# Patient Record
Sex: Male | Born: 1940 | Race: White | Hispanic: No | Smoking: Former smoker
Health system: Southern US, Community
[De-identification: ages and names within clinical notes are randomized; demographics above are authoritative.]

## PROBLEM LIST (undated history)

## (undated) DIAGNOSIS — C801 Malignant (primary) neoplasm, unspecified: Secondary | ICD-10-CM

---

## 2017-01-13 ENCOUNTER — Institutional Professional Consult (permissible substitution): Payer: Self-pay | Admitting: Internal Medicine

## 2017-01-22 ENCOUNTER — Encounter: Payer: Self-pay | Admitting: Internal Medicine

## 2017-01-22 ENCOUNTER — Ambulatory Visit (INDEPENDENT_AMBULATORY_CARE_PROVIDER_SITE_OTHER)
Admission: RE | Admit: 2017-01-22 | Discharge: 2017-01-22 | Disposition: A | Discharge status: Home / Self Care | Payer: Medicare Other | Source: Ambulatory Visit | Attending: Internal Medicine | Admitting: Internal Medicine

## 2017-01-22 ENCOUNTER — Telehealth: Payer: Self-pay | Admitting: Internal Medicine

## 2017-01-22 ENCOUNTER — Ambulatory Visit (INDEPENDENT_AMBULATORY_CARE_PROVIDER_SITE_OTHER): Payer: Medicare Other | Admitting: Internal Medicine

## 2017-01-22 VITALS — BP 130/60 | HR 65 | Ht 69.5 in | Wt 212.8 lb

## 2017-01-22 DIAGNOSIS — R938 Abnormal findings on diagnostic imaging of other specified body structures: Secondary | ICD-10-CM | POA: Diagnosis not present

## 2017-01-22 DIAGNOSIS — J449 Chronic obstructive pulmonary disease, unspecified: Secondary | ICD-10-CM

## 2017-01-22 DIAGNOSIS — R9389 Abnormal findings on diagnostic imaging of other specified body structures: Secondary | ICD-10-CM

## 2017-01-22 MED ORDER — BUDESONIDE-FORMOTEROL FUMARATE 80-4.5 MCG/ACT IN AERO: 2.0000 | INHALATION_SPRAY | Freq: Two times a day (BID) | RESPIRATORY_TRACT | 0 refills | Status: AC

## 2017-01-22 NOTE — Telephone Encounter (Signed)
See ov / result note same day

## 2017-01-22 NOTE — Telephone Encounter (Signed)
Received call report for pt's CXR today 01/22/17 from [facility] radiology. Impression is below:   IMPRESSION: Cardiomegaly.  Fullness in the right infrahilar region. Contrast enhanced CT of the chest is recommended for further evaluation.  These results will be called to the ordering clinician or representative by the Radiologist Assistant, and communication documented in the PACS or zVision Dashboard.   Electronically Signed   By: Inez Catalina M.D.   On: 01/22/2017 12:41

## 2017-01-22 NOTE — Progress Notes (Signed)
Subjective:    Patient ID: Bradley Zimmerman, male    DOB: 08-24-1941,    MRN: [id number]  HPI  53 yowm  Quit smoking 2008 without any sequelae but around 2012 onset of doe gradually worse   so referred to pulmonary clinic 01/22/2017 by Dr   Orma Render - pt is s/p MVR and CAF   01/22/2017 1st [facility] office visit/ Tegan Britain   Chief Complaint  Patient presents with  . Pulmonary Consult    Referred by Dr. Doristine Section Bonsu. Pt states that he had been having SOB for the past year, but since started on Symbicort 1 month ago. He states that now his breaething is "100% better".   gradually better since taking symb and cut on avg one puff each am and no need now for saba  Doe = MMRC1 = can walk nl pace, flat grade, can't hurry or go uphills or steps s sob    Sleep fine/ wakes up fine s rattling/ congestion overnight   No obvious day to day or daytime variability or assoc excess/ purulent sputum or mucus plugs or hemoptysis or cp or chest tightness, subjective wheeze or overt sinus or hb symptoms. No unusual exp hx or h/o childhood pna/ asthma or knowledge of premature birth.  Sleeping ok without nocturnal  or early am exacerbation  of respiratory  c/o's or need for noct saba. Also denies any obvious fluctuation of symptoms with weather or environmental changes or other aggravating or alleviating factors except as outlined above   Current Medications, Allergies, Complete Past Medical History, Past Surgical History, Family History, and Social History were reviewed in [employer] record.  ROS  The following are not active complaints unless bolded sore throat, dysphagia, dental problems, itching, sneezing,  nasal congestion or excess/ purulent secretions, ear ache,   fever, chills, sweats, unintended wt loss, classically pleuritic or exertional cp,  orthopnea pnd or leg swelling, presyncope, palpitations, abdominal pain, anorexia, nausea, vomiting, diarrhea  or change in bowel or  bladder habits, change in stools or urine, dysuria,hematuria,  rash, arthralgias, visual complaints, headache, numbness, weakness or ataxia or problems with walking or coordination,  change in mood/affect or memory.             Review of Systems     Objective:   Physical Exam     amb somber wm nad occ throat clearing   Wt Readings from Last 3 Encounters:  01/22/17 212 lb 12.8 oz (96.5 kg)    Vital signs reviewed  - Note on arrival 02 sats  98% on RA    HEENT: nl  turbinates bilaterally, and oropharynx. Nl external ear canals without cough reflex- Full denture above/ partial below   NECK :  without JVD/Nodes/TM/ nl carotid upstrokes bilaterally   LUNGS: no acc muscle use,   Distant bs bilaterally with hyper resonance to percussion/ no cough on insp or exp    CV:  IRIR   no s3 with mechanical S1  no murmur or increase in P2, and  1+ sym pitting lower ext  edema   ABD:  soft and nontender with   Pos hoover's end inspiratory   in the supine position. No bruits or organomegaly appreciated, bowel sounds nl  MS:  Nl gait/ ext warm without deformities, calf tenderness, cyanosis or clubbing No obvious joint restrictions   SKIN: warm and dry without lesions    NEURO:  alert, approp, nl sensorium with  no motor or cerebellar  deficits apparent.     CXR PA and Lateral:   01/22/2017 :    I personally reviewed images and agree with radiology impression as follows:   Cardiomegaly. Fullness in the right infrahilar region. Contrast enhanced CT of the chest is recommended for further evaluation.        Assessment & Plan:

## 2017-01-22 NOTE — Patient Instructions (Addendum)
Work on inhaler technique:  relax and gently blow all the way out then take a nice smooth deep breath back in, triggering the inhaler at same time you start breathing in.  Hold for up to 5 seconds if you can. Blow out thru nose. Rinse and gargle with water when done   Please remember to go to the   x-ray department downstairs in the basement  for your tests - we will call you with the results when they are available.   if your cough or throat clearing start to bother you more you will need a trial off of Ramapril  X 6 weeks before additional work up should be considered    Add CT with contrast needed re ? Hilar mass vs large PA

## 2017-01-23 ENCOUNTER — Other Ambulatory Visit: Payer: Self-pay | Admitting: Internal Medicine

## 2017-01-23 DIAGNOSIS — R9389 Abnormal findings on diagnostic imaging of other specified body structures: Secondary | ICD-10-CM

## 2017-01-23 NOTE — Assessment & Plan Note (Signed)
See cxr 01/22/2017 > Ct with contrast rec (last bun/creat =  26/1.33 10/16/16   I suspect what we're seeing is large PA's but agree with radiology need to r/o mass

## 2017-01-23 NOTE — Assessment & Plan Note (Signed)
Spirometry 01/22/2017  FEV1 1.29 (44%)  Ratio 52 p nothing prior to study - 01/22/2017  After extensive coaching HFA effectiveness =    75%    Despite suboptimal hfa and obvious co-existing heart dz / acei /coreg use his breathing is actually relatively good and not having aecopd by hx so could use lama by itself here or lama/laba if breathing gets limited but doing so well on such low doses of symbicort there's probably no reason to change rx   Reviewed with pt: .> 3 min discussion I reviewed the Fletcher curve with the patient that basically indicates  if you quit smoking when your best day FEV1 is  preserved (as is relatively still the case here)  it is highly unlikely you will progress to severe disease and informed the patient there was  no medication on the market that has proven to alter the curve/ its downward trajectory  or the likelihood of progression of their disease(unlike other chronic medical conditions such as atheroclerosis where we do think we can change the natural hx with risk reducing meds)    Therefore s  maintaining abstinence is the most important aspect of care, not choice of inhalers or for that matter, doctors.     If breathing does deteriorate, would obviously max symb dosing to 2 bid and rx chf/ consider ACEi effects and would prefer in this setting: Bystolic, the most beta -1  selective Beta blocker available in sample form, with bisoprolol the most selective generic choice  on the market.   Since this is not an issue now though I did not rec any changes  Total time devoted to counseling  > 50 % of initial 60 min office visit:  review case with pt/ discussion of options/alternatives/ personally creating written customized instructions  in presence of pt  then going over those specific  Instructions directly with the pt including how to use all of the meds but in particular covering each new medication in detail and the difference between the maintenance= "automatic" meds and the  prns using an action plan format for the latter (If this problem/symptom => do that organization reading Left to right).  Please see AVS from this visit for a full list of these instructions which I personally wrote for this pt and  are unique to this visit.

## 2017-01-23 NOTE — Progress Notes (Signed)
Spoke with pt and notified of results per Dr. Wert. Pt verbalized understanding and denied any questions. 

## 2017-02-04 ENCOUNTER — Encounter: Payer: Self-pay | Admitting: Internal Medicine

## 2017-02-04 ENCOUNTER — Other Ambulatory Visit (INDEPENDENT_AMBULATORY_CARE_PROVIDER_SITE_OTHER): Payer: Medicare Other

## 2017-02-04 DIAGNOSIS — R938 Abnormal findings on diagnostic imaging of other specified body structures: Secondary | ICD-10-CM | POA: Diagnosis not present

## 2017-02-04 DIAGNOSIS — I10 Essential (primary) hypertension: Secondary | ICD-10-CM | POA: Insufficient documentation

## 2017-02-04 DIAGNOSIS — R9389 Abnormal findings on diagnostic imaging of other specified body structures: Secondary | ICD-10-CM

## 2017-02-04 LAB — BASIC METABOLIC PANEL
BUN: 31 mg/dL — ABNORMAL HIGH (ref 6–23)
CO2: 26 mEq/L (ref 19–32)
Calcium: 10 mg/dL (ref 8.4–10.5)
Chloride: 105 mEq/L (ref 96–112)
Creatinine, Ser: 1.65 mg/dL — ABNORMAL HIGH (ref 0.40–1.50)
GFR: 43.27 mL/min — AB (ref >60.00)
GLUCOSE: 116 mg/dL — AB (ref 70–99)
POTASSIUM: 5.5 meq/L — AB (ref 3.5–5.1)
SODIUM: 137 meq/L (ref 135–145)

## 2017-02-05 ENCOUNTER — Other Ambulatory Visit: Payer: Self-pay | Admitting: Nephrology

## 2017-02-05 ENCOUNTER — Ambulatory Visit (HOSPITAL_BASED_OUTPATIENT_CLINIC_OR_DEPARTMENT_OTHER)
Admission: RE | Admit: 2017-02-05 | Discharge: 2017-02-05 | Disposition: A | Discharge status: Home / Self Care | Payer: Medicare Other | Source: Ambulatory Visit | Attending: Internal Medicine | Admitting: Internal Medicine

## 2017-02-05 ENCOUNTER — Encounter (HOSPITAL_BASED_OUTPATIENT_CLINIC_OR_DEPARTMENT_OTHER): Payer: Self-pay

## 2017-02-05 DIAGNOSIS — R938 Abnormal findings on diagnostic imaging of other specified body structures: Secondary | ICD-10-CM | POA: Diagnosis present

## 2017-02-05 DIAGNOSIS — N183 Chronic kidney disease, stage 3 unspecified: Secondary | ICD-10-CM

## 2017-02-05 DIAGNOSIS — R918 Other nonspecific abnormal finding of lung field: Secondary | ICD-10-CM | POA: Insufficient documentation

## 2017-02-05 DIAGNOSIS — R591 Generalized enlarged lymph nodes: Secondary | ICD-10-CM | POA: Diagnosis not present

## 2017-02-05 DIAGNOSIS — R9389 Abnormal findings on diagnostic imaging of other specified body structures: Secondary | ICD-10-CM

## 2017-02-05 HISTORY — DX: Malignant (primary) neoplasm, unspecified: C80.1

## 2017-02-05 MED ORDER — IOPAMIDOL (ISOVUE-300) INJECTION 61%
100.0000 mL | Freq: Once | INTRAVENOUS | Status: AC | PRN
  Administered 2017-02-05: 60 mL via INTRAVENOUS

## 2017-02-05 NOTE — Progress Notes (Signed)
LMTCB

## 2017-02-06 ENCOUNTER — Other Ambulatory Visit: Payer: Self-pay | Admitting: Internal Medicine

## 2017-02-06 DIAGNOSIS — J449 Chronic obstructive pulmonary disease, unspecified: Secondary | ICD-10-CM

## 2017-02-06 DIAGNOSIS — R9389 Abnormal findings on diagnostic imaging of other specified body structures: Secondary | ICD-10-CM

## 2017-02-12 ENCOUNTER — Ambulatory Visit
Admission: RE | Admit: 2017-02-12 | Discharge: 2017-02-12 | Disposition: A | Discharge status: Home / Self Care | Payer: Medicare Other | Source: Ambulatory Visit | Attending: Nephrology | Admitting: Nephrology

## 2017-02-12 DIAGNOSIS — N183 Chronic kidney disease, stage 3 unspecified: Secondary | ICD-10-CM

## 2017-02-17 ENCOUNTER — Encounter (HOSPITAL_COMMUNITY)
Admission: RE | Admit: 2017-02-17 | Discharge: 2017-02-17 | Disposition: A | Discharge status: Home / Self Care | Payer: Medicare Other | Source: Ambulatory Visit | Attending: Internal Medicine | Admitting: Internal Medicine

## 2017-02-17 DIAGNOSIS — R938 Abnormal findings on diagnostic imaging of other specified body structures: Secondary | ICD-10-CM | POA: Insufficient documentation

## 2017-02-17 DIAGNOSIS — R9389 Abnormal findings on diagnostic imaging of other specified body structures: Secondary | ICD-10-CM

## 2017-02-17 LAB — GLUCOSE, CAPILLARY: Glucose-Capillary: 112 mg/dL — ABNORMAL HIGH (ref 65–99)

## 2017-02-17 MED ORDER — FLUDEOXYGLUCOSE F - 18 (FDG) INJECTION: 9.6000 | Freq: Once | INTRAVENOUS | Status: DC | PRN

## 2017-03-11 NOTE — Progress Notes (Signed)
Dr. Melvyn Novas- I was able to speak with the pt by calling his son Jeremy's phone. Pt states that he has been admitted to [place] and was dxed with Lung CA. This is why he did not return our calls. You can review his records in [place]. Please advise if further testing is still needed.

## 2017-07-23 DEATH — deceased

## 2017-12-16 IMAGING — CT CT CHEST W/ CM
2 of 3 series · 15 of 36 positions shown, 18 images · IV contrast (iopamidol)
Comparison: Chest radiographs dated 01/22/2017

CLINICAL DATA: Abnormal chest radiograph

EXAM:
CT CHEST WITH CONTRAST
TECHNIQUE: Multidetector CT imaging of the chest was performed during
intravenous contrast administration.
CONTRAST:  60mL USAPMB-M55 IOPAMIDOL (USAPMB-M55) INJECTION 61%

[Series 2: axial st · axial · 0.73mm/px · z∈[+1134,+1410]mm · 12 of 163 slices shown, 15 images]
[im 13/163  mediastinal]
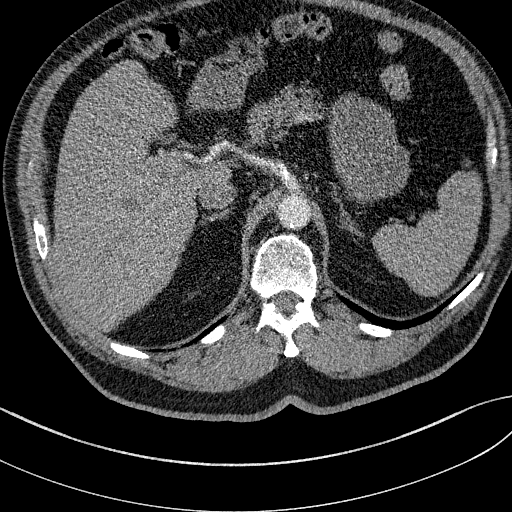
[im 13/163  lung]
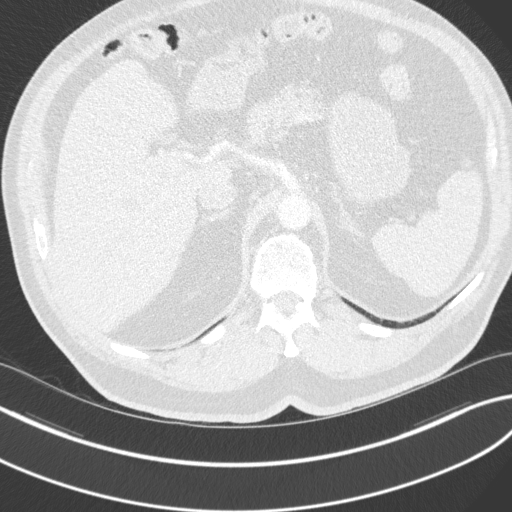
[im 25/163  lung]
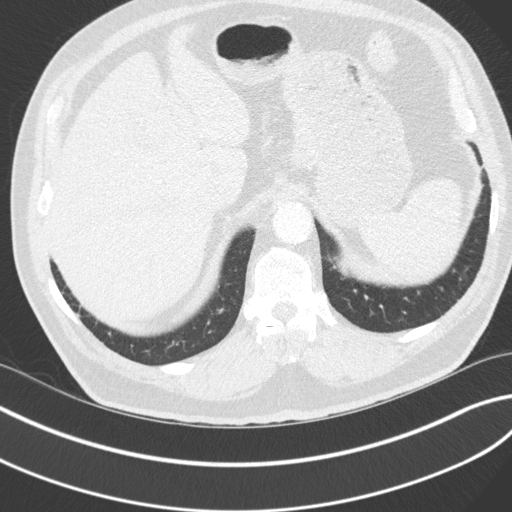
[im 37/163  lung]
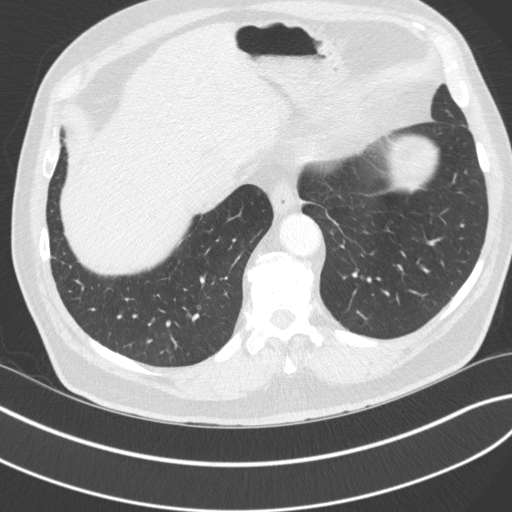
[im 49/163  lung]
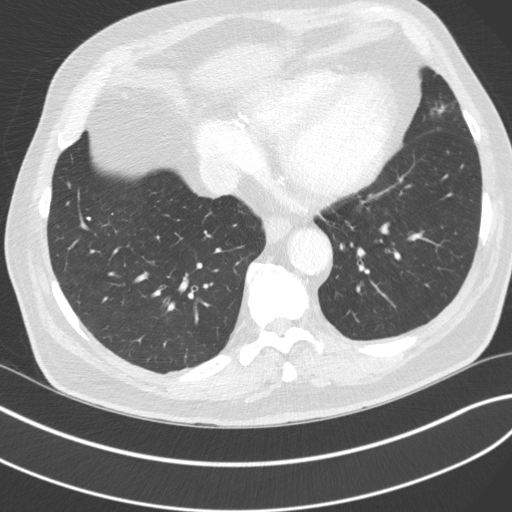
[im 61/163  mediastinal]
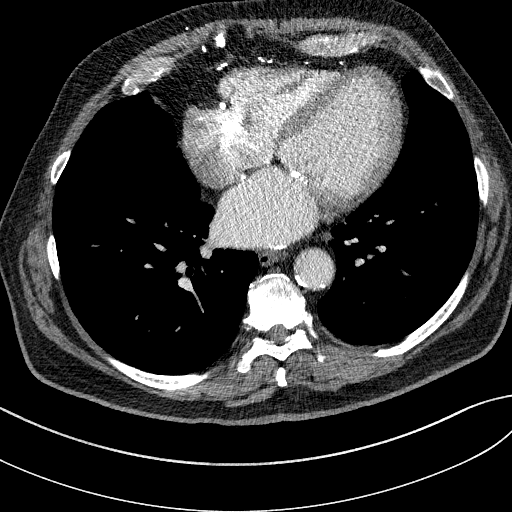
[im 61/163  lung]
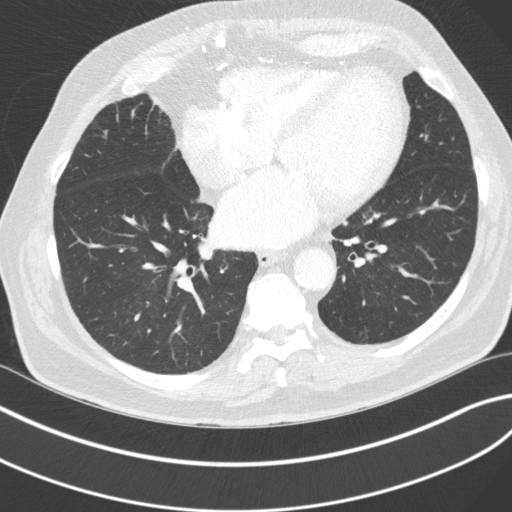
[im 73/163  lung]
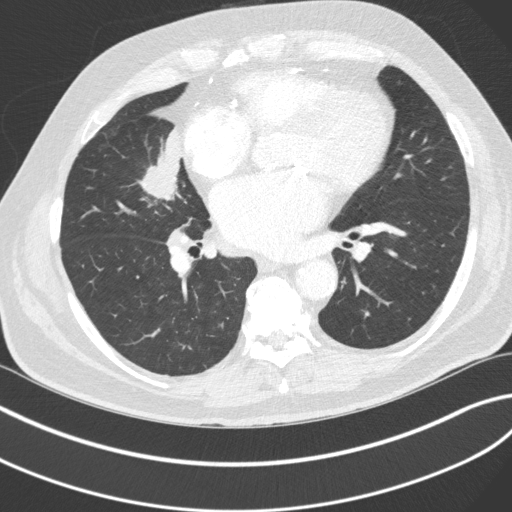
[im 91/163  lung]
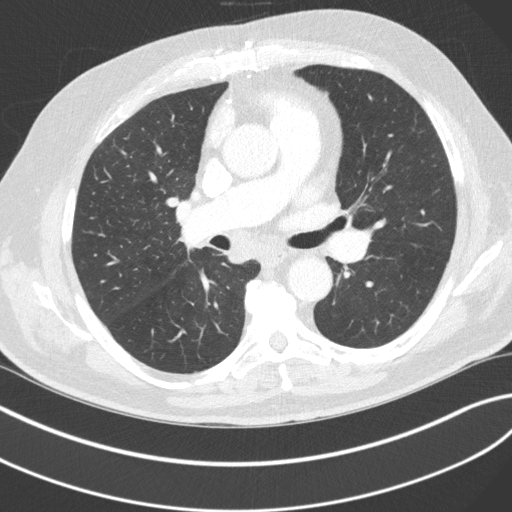
[im 103/163  lung]
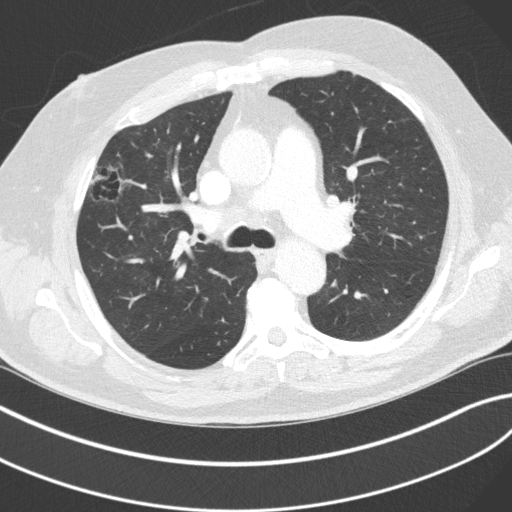
[im 115/163  mediastinal]
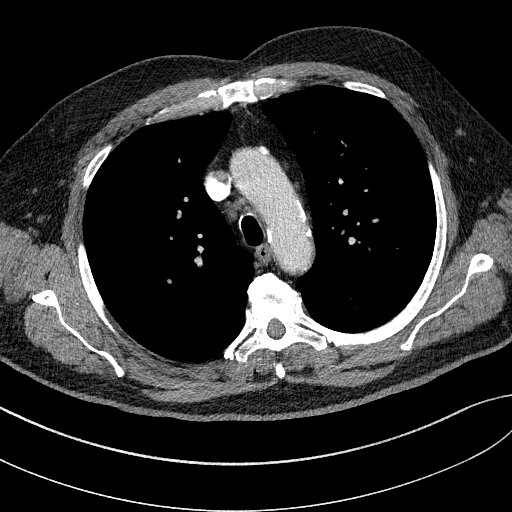
[im 115/163  lung]
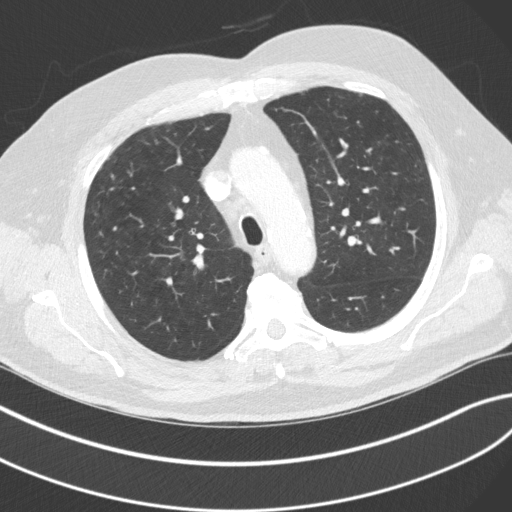
[im 127/163  lung]
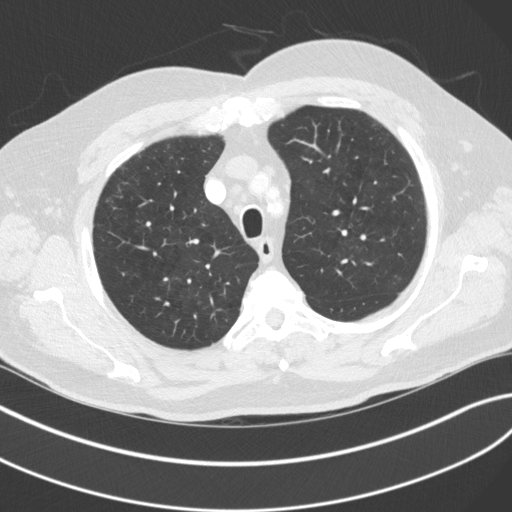
[im 139/163  lung]
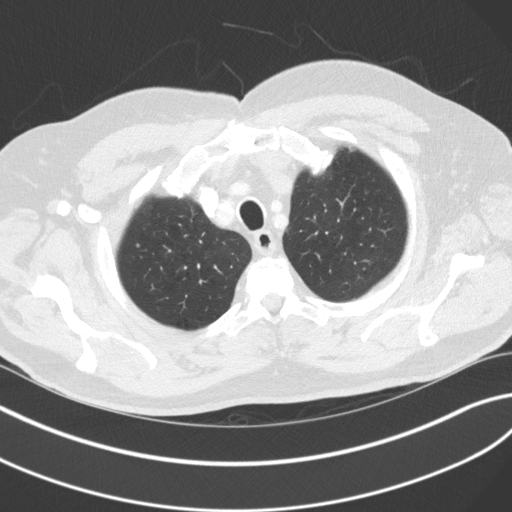
[im 151/163  lung]
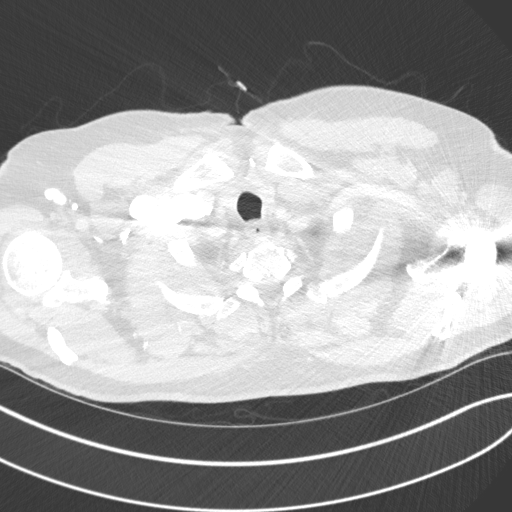

[Series 5: coronal · coronal · 0.63mm/px · 3 of 170 slices shown]
[im 34/170  lung]
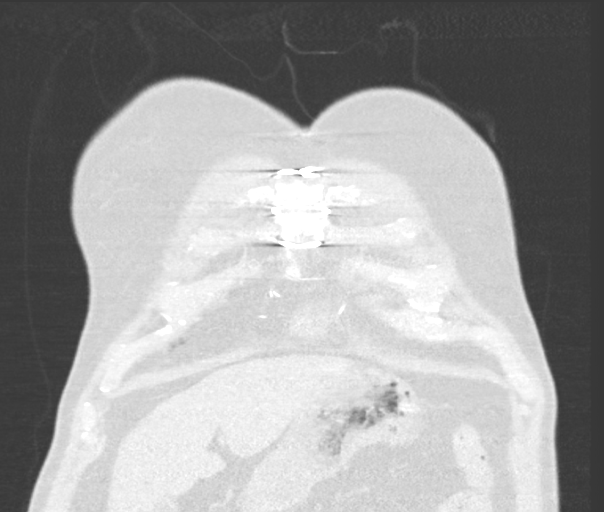
[im 68/170  lung]
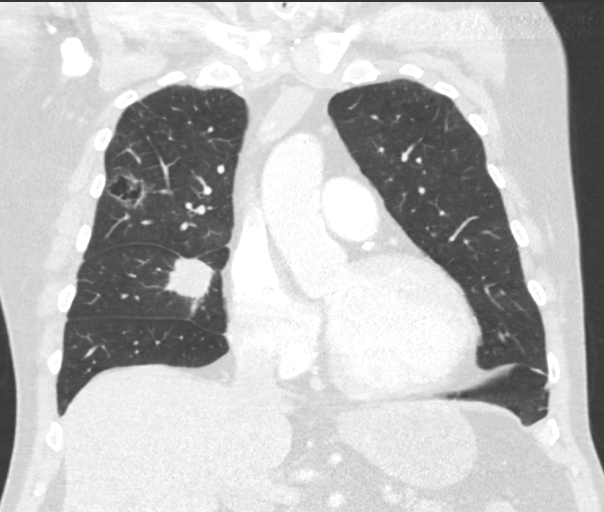
[im 102/170  lung]
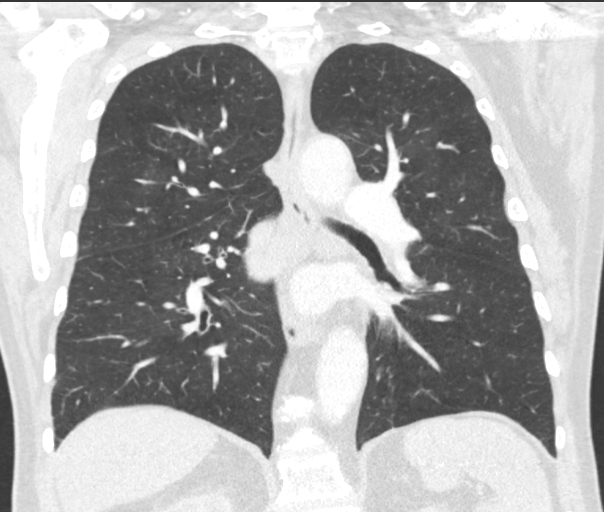

[15 of 36 positions shown; findings below may reference images not displayed]

FINDINGS: Cardiovascular: The heart is normal in size. No pericardial
effusion.

Coronary atherosclerosis the LAD and right coronary artery.

Prosthetic mitral valve.

Atherosclerotic calcification the aortic arch. No evidence of
thoracic aortic aneurysm.

Mediastinum/Nodes: Thoracic lymph nodes, including:

--1.0 cm short axis high right paratracheal node (series 2/ image
41)

--1.8 cm short axis low right paratracheal node (series 2/image 57)

--1.4 cm short axis right hilar node (series 2/image 67)

--2.0 cm short axis node along the right azygoesophageal recess
(series 2/ image 84)

Visualized thyroid is unremarkable.

Lungs/Pleura: 4.5 x 2.5 x 3.5 cm right middle lobe mass-like opacity
(series 3/ image 88). While some of this opacity may reflect
postobstructive atelectasis medially (series 3/image 95), the
spiculated nature of the opacity centrally/laterally is highly
concerning for primary bronchogenic neoplasm (coronal image 70).

3.5 cm thin-walled cavitary lesion in the anterior right upper lobe
(series 3/ image 60).

3 mm calcified granuloma in the right upper lobe along the minor
fissure (series 3/image 77). Additional 4 mm calcified granuloma in
the left lower lobe (series 3/ image 110). 3 mm right lower lobe
calcified granuloma (series 3/image 99).

Underlying mild centrilobular emphysematous changes in the bilateral
upper lobes.

No focal consolidation.

No pleural effusion or pneumothorax.

Upper Abdomen: Visualized upper abdomen is notable for an 11 mm
probable cyst in the central liver (series 2/image 125).

Musculoskeletal: Degenerative changes of the visualized
thoracolumbar spine.

Median sternotomy.
IMPRESSION: 4.5 cm right middle lobe mass-like opacity, highly concerning for
primary bronchogenic neoplasm. Consider bronchoscopy, percutaneous
sampling, and/or PET-CT as clinically warranted.

Mediastinal and right hilar lymphadenopathy, suspicious for nodal
metastases.

## 2019-03-14 IMAGING — US US RENAL
1 series · 14 of 25 positions shown · non-contrast
Comparison: None.

CLINICAL DATA: Chronic kidney disease

EXAM:
RENAL / URINARY TRACT ULTRASOUND COMPLETE

[Series 1: us renal · 0.25mm/px · 14 of 28 slices shown]
[im 1/28]
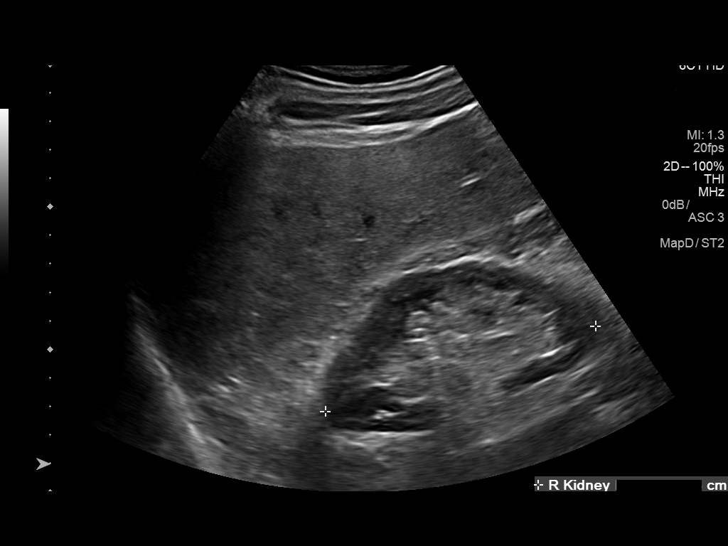
[im 3/28]
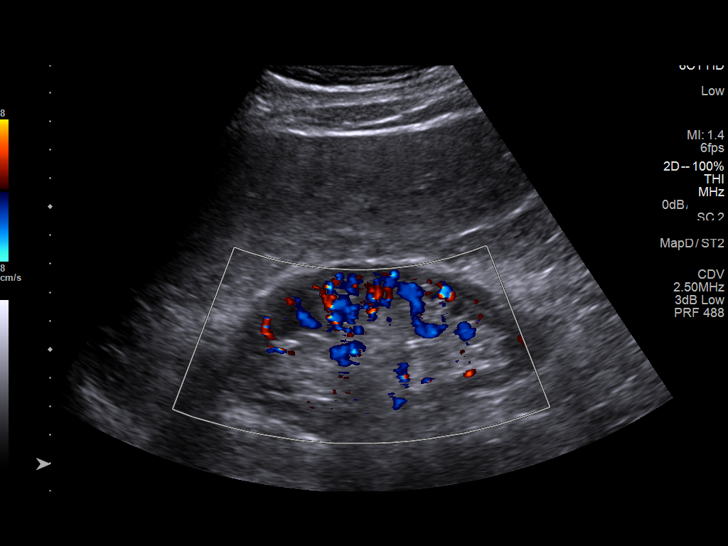
[im 5/28]
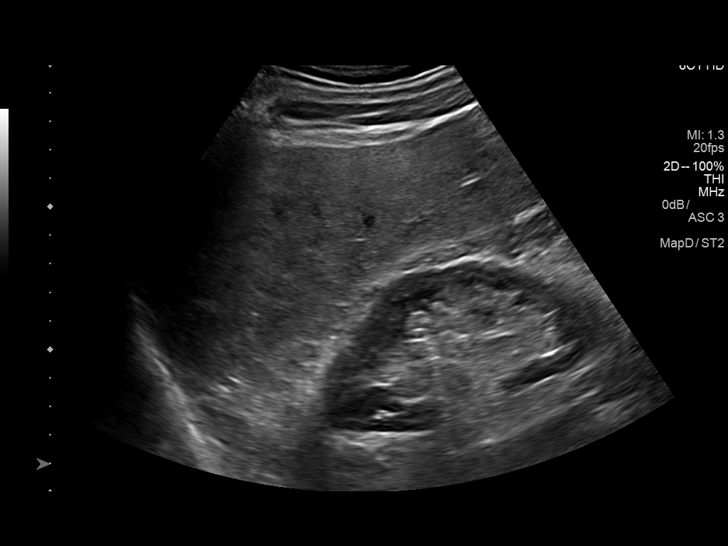
[im 7/28]
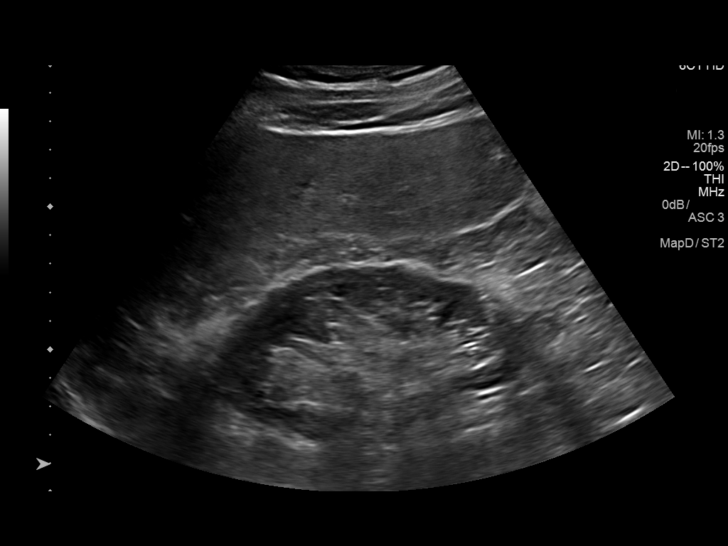
[im 10/28]
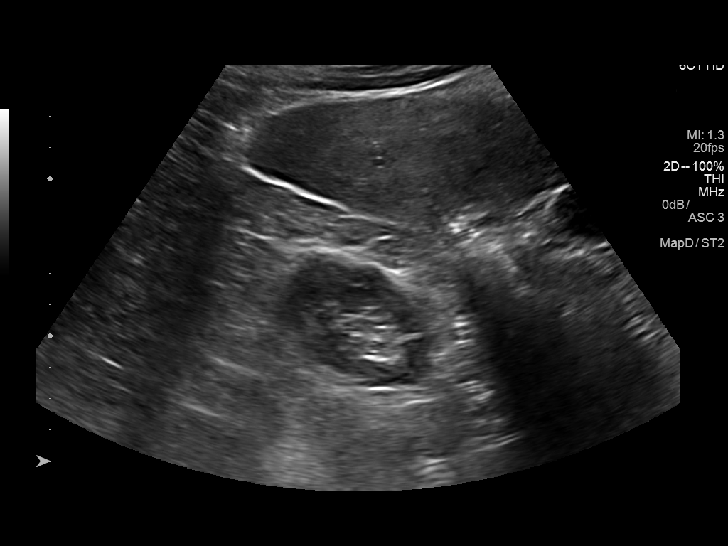
[im 11/28]
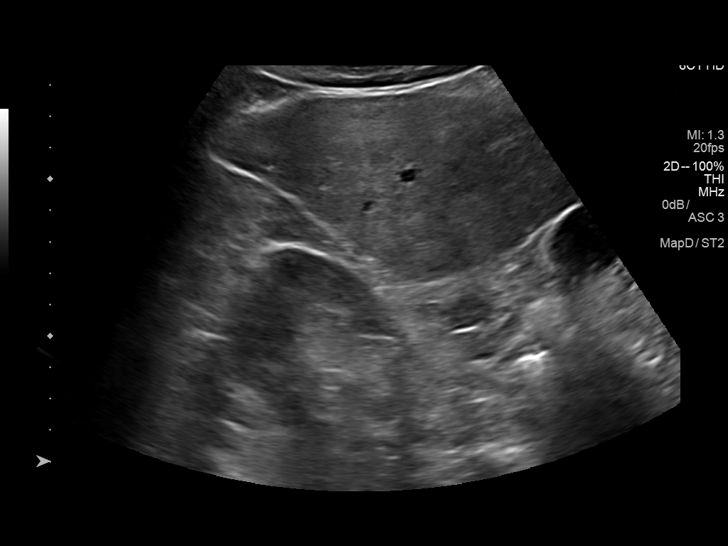
[im 13/28]
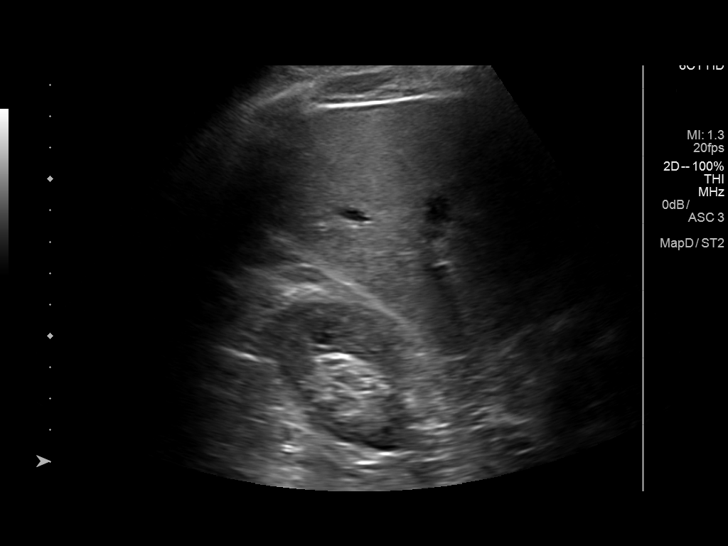
[im 15/28]
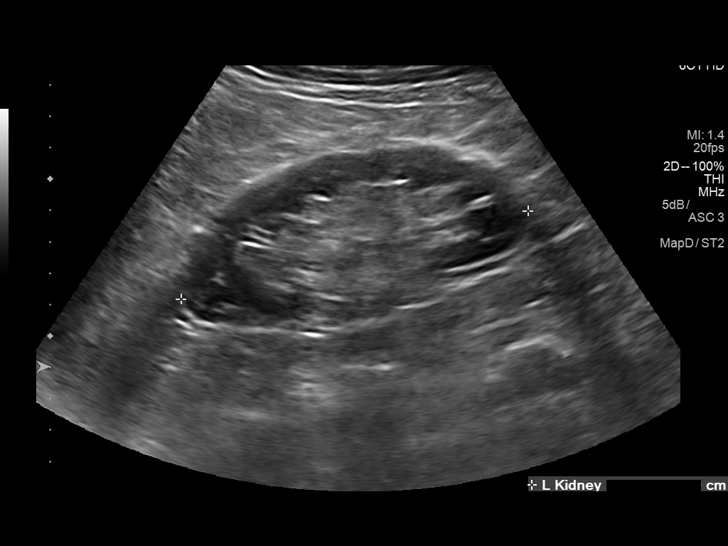
[im 17/28]
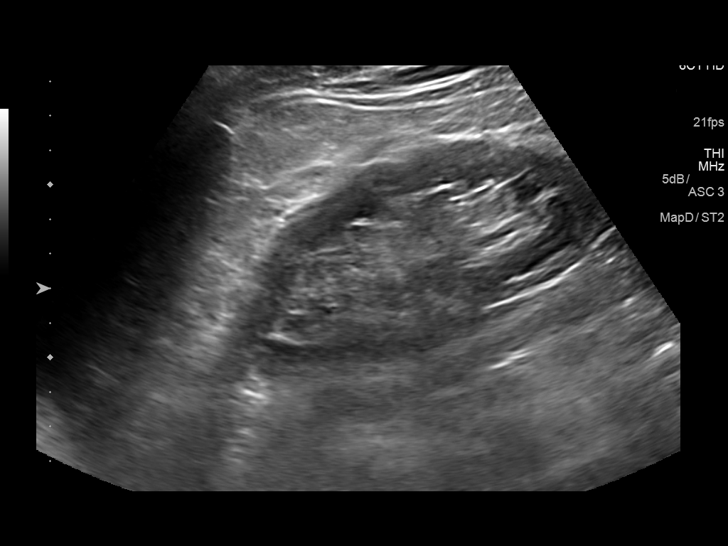
[im 19/28]
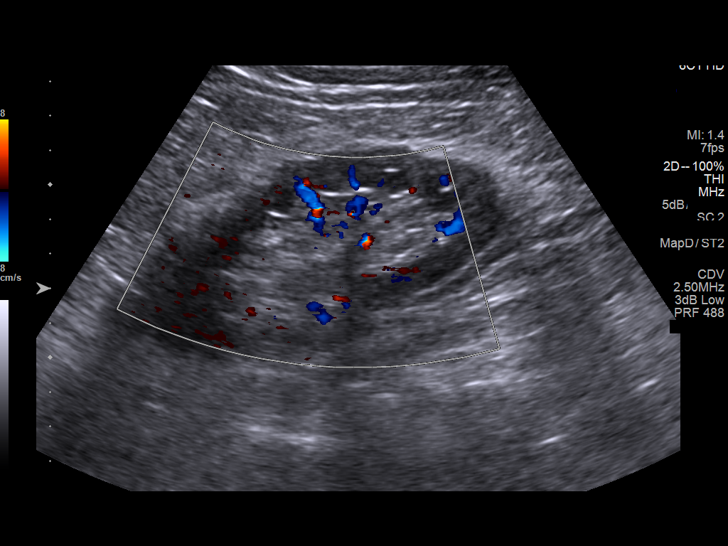
[im 21/28]
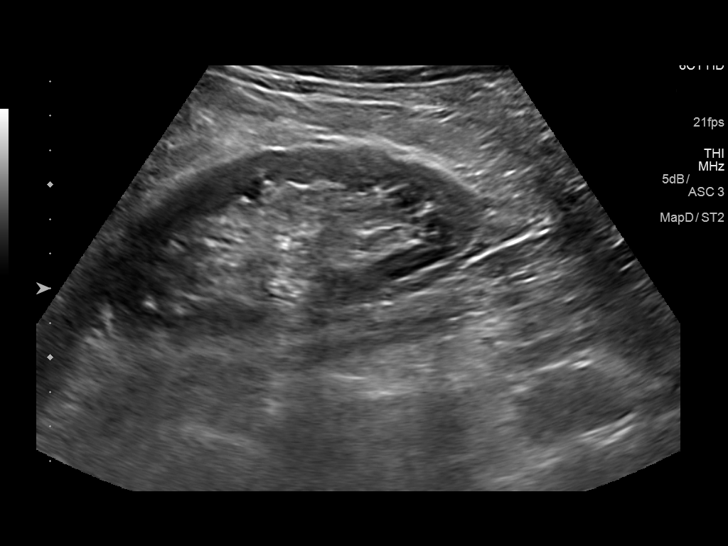
[im 23/28]
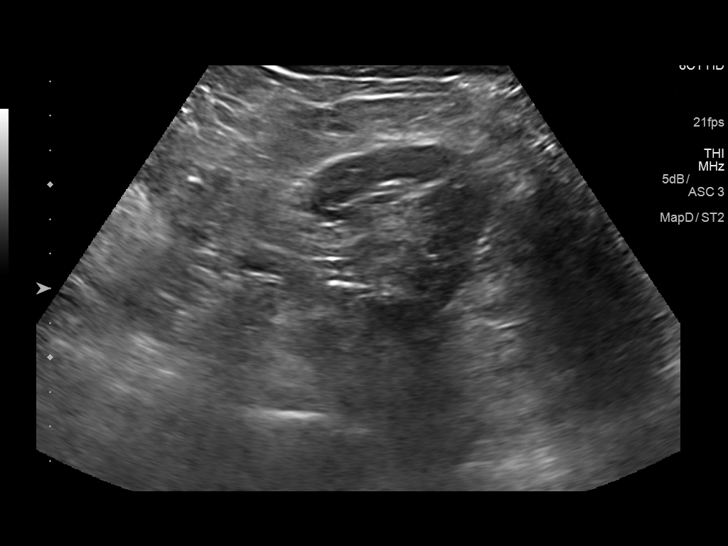
[im 25/28]
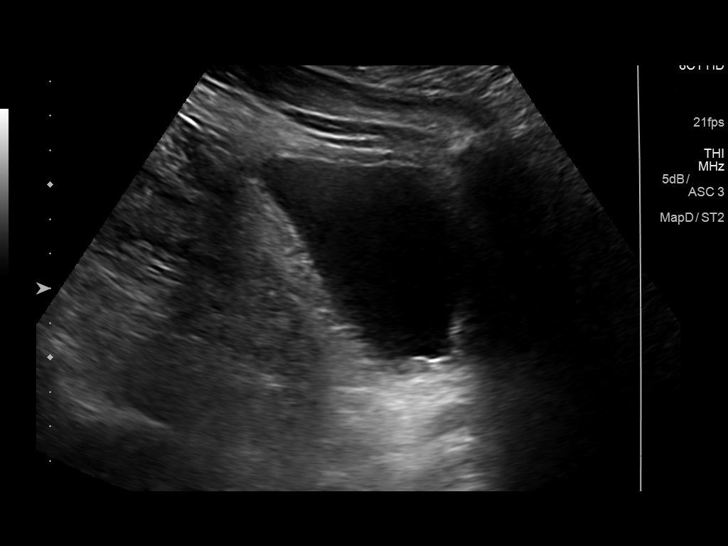
[im 28/28]
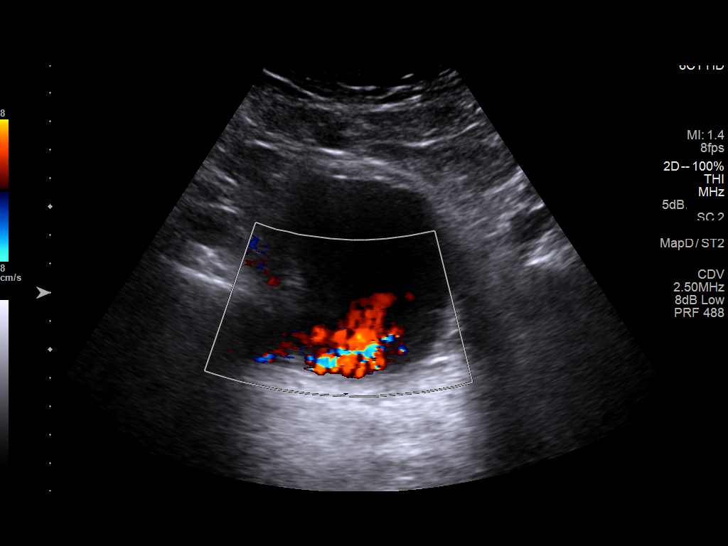

[14 of 25 positions shown; findings below may reference images not displayed]

FINDINGS: Right Kidney:

Length: 11.9 cm. There is diffuse cortical thinning. Echogenicity
within normal limits. No mass or hydronephrosis visualized.

Left Kidney:

Length: 11.4 cm. There is diffuse cortical thinning. Echogenicity
within normal limits. No mass or hydronephrosis visualized.

Bladder:

Appears normal for degree of bladder distention. Bilateral ureteral
jets were visualized.
IMPRESSION: Bilateral renal cortical thinning, consistent with chronic medical
renal disease. No hydronephrosis.
